# Patient Record
Sex: Female | Born: 1975 | Race: White | Hispanic: No | Marital: Married | Smoking: Never smoker
Health system: Southern US, Community
[De-identification: ages and names within clinical notes are randomized; demographics above are authoritative.]

## PROBLEM LIST (undated history)

## (undated) DIAGNOSIS — D569 Thalassemia, unspecified: Secondary | ICD-10-CM

## (undated) DIAGNOSIS — Z9289 Personal history of other medical treatment: Secondary | ICD-10-CM

## (undated) HISTORY — PX: CARPAL TUNNEL RELEASE: SHX101

## (undated) HISTORY — PX: ABDOMINAL HYSTERECTOMY: SHX81

---

## 2015-04-02 ENCOUNTER — Encounter (HOSPITAL_COMMUNITY): Payer: Self-pay | Admitting: *Deleted

## 2015-04-02 ENCOUNTER — Emergency Department (HOSPITAL_COMMUNITY): Payer: Self-pay

## 2015-04-02 ENCOUNTER — Emergency Department (HOSPITAL_COMMUNITY)
Admission: EM | Admit: 2015-04-02 | Discharge: 2015-04-02 | Disposition: A | Discharge status: Home / Self Care | Payer: Self-pay | Attending: Emergency Medicine | Admitting: Emergency Medicine

## 2015-04-02 DIAGNOSIS — Z862 Personal history of diseases of the blood and blood-forming organs and certain disorders involving the immune mechanism: Secondary | ICD-10-CM | POA: Insufficient documentation

## 2015-04-02 DIAGNOSIS — M791 Myalgia: Secondary | ICD-10-CM | POA: Insufficient documentation

## 2015-04-02 DIAGNOSIS — R079 Chest pain, unspecified: Secondary | ICD-10-CM | POA: Insufficient documentation

## 2015-04-02 DIAGNOSIS — R52 Pain, unspecified: Secondary | ICD-10-CM

## 2015-04-02 DIAGNOSIS — R51 Headache: Secondary | ICD-10-CM | POA: Insufficient documentation

## 2015-04-02 HISTORY — DX: Thalassemia, unspecified: D56.9

## 2015-04-02 HISTORY — DX: Personal history of other medical treatment: Z92.89

## 2015-04-02 LAB — COMPREHENSIVE METABOLIC PANEL
ALT: 16 U/L (ref 14–54)
ANION GAP: 7 (ref 5–15)
AST: 19 U/L (ref 15–41)
Albumin: 4.2 g/dL (ref 3.5–5.0)
Alkaline Phosphatase: 101 U/L (ref 38–126)
BUN: 10 mg/dL (ref 6–20)
CO2: 26 mmol/L (ref 22–32)
Calcium: 10.2 mg/dL (ref 8.9–10.3)
Chloride: 106 mmol/L (ref 101–111)
Creatinine, Ser: 0.8 mg/dL (ref 0.44–1.00)
GFR calc non Af Amer: >60 mL/min (ref >60)
Glucose, Bld: 102 mg/dL — ABNORMAL HIGH (ref 65–99)
Potassium: 4.2 mmol/L (ref 3.5–5.1)
SODIUM: 139 mmol/L (ref 135–145)
Total Bilirubin: 0.4 mg/dL (ref 0.3–1.2)
Total Protein: 7.5 g/dL (ref 6.5–8.1)

## 2015-04-02 LAB — TROPONIN I

## 2015-04-02 LAB — CBC WITH DIFFERENTIAL/PLATELET
BASOS PCT: 0 % (ref 0–1)
Basophils Absolute: 0 10*3/uL (ref 0.0–0.1)
EOS ABS: 0 10*3/uL (ref 0.0–0.7)
Eosinophils Relative: 0 % (ref 0–5)
HCT: 43.6 % (ref 36.0–46.0)
Hemoglobin: 14.1 g/dL (ref 12.0–15.0)
LYMPHS PCT: 26 % (ref 12–46)
Lymphs Abs: 1.7 10*3/uL (ref 0.7–4.0)
MCH: 31.4 pg (ref 26.0–34.0)
MCHC: 32.3 g/dL (ref 30.0–36.0)
MCV: 97.1 fL (ref 78.0–100.0)
MONO ABS: 0.6 10*3/uL (ref 0.1–1.0)
Monocytes Relative: 9 % (ref 3–12)
NEUTROS ABS: 4.2 10*3/uL (ref 1.7–7.7)
Neutrophils Relative %: 65 % (ref 43–77)
Platelets: 301 10*3/uL (ref 150–400)
RBC: 4.49 MIL/uL (ref 3.87–5.11)
RDW: 13.6 % (ref 11.5–15.5)
WBC: 6.5 10*3/uL (ref 4.0–10.5)

## 2015-04-02 LAB — TYPE AND SCREEN
ABO/RH(D): A POS
Antibody Screen: POSITIVE
DAT, IgG: NEGATIVE
PT AG Type: NEGATIVE

## 2015-04-02 MED ORDER — HYDROMORPHONE HCL 1 MG/ML IJ SOLN
1.0000 mg | Freq: Once | INTRAMUSCULAR | Status: AC
  Administered 2015-04-02: 1 mg via INTRAMUSCULAR
  Filled 2015-04-02: qty 1

## 2015-04-02 MED ORDER — ONDANSETRON 4 MG PO TBDP
4.0000 mg | ORAL_TABLET | Freq: Once | ORAL | Status: AC
  Administered 2015-04-02: 4 mg via ORAL
  Filled 2015-04-02: qty 1

## 2015-04-02 NOTE — ED Notes (Signed)
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

## 2015-04-02 NOTE — Discharge Instructions (Signed)
Chest Pain (Nonspecific) °It is often hard to give a specific diagnosis for the cause of chest pain. There is always a chance that your pain could be related to something serious, such as a heart attack or a blood clot in the lungs. You need to follow up with your health care provider for further evaluation. °CAUSES  °· Heartburn. °· Pneumonia or bronchitis. °· Anxiety or stress. °· Inflammation around your heart (pericarditis) or lung (pleuritis or pleurisy). °· A blood clot in the lung. °· A collapsed lung (pneumothorax). It can develop suddenly on its own (spontaneous pneumothorax) or from trauma to the chest. °· Shingles infection (herpes zoster virus). °The chest wall is composed of bones, muscles, and cartilage. Any of these can be the source of the pain. °· The bones can be bruised by injury. °· The muscles or cartilage can be strained by coughing or overwork. °· The cartilage can be affected by inflammation and become sore (costochondritis). °DIAGNOSIS  °Lab tests or other studies may be needed to find the cause of your pain. Your health care provider may have you take a test called an ambulatory electrocardiogram (ECG). An ECG records your heartbeat patterns over a 24-hour period. You may also have other tests, such as: °· Transthoracic echocardiogram (TTE). During echocardiography, sound waves are used to evaluate how blood flows through your heart. °· Transesophageal echocardiogram (TEE). °· Cardiac monitoring. This allows your health care provider to monitor your heart rate and rhythm in real time. °· Holter monitor. This is a portable device that records your heartbeat and can help diagnose heart arrhythmias. It allows your health care provider to track your heart activity for several days, if needed. °· Stress tests by exercise or by giving medicine that makes the heart beat faster. °TREATMENT  °· Treatment depends on what may be causing your chest pain. Treatment may include: °¨ Acid blockers for  heartburn. °¨ Anti-inflammatory medicine. °¨ Pain medicine for inflammatory conditions. °¨ Antibiotics if an infection is present. °· You may be advised to change lifestyle habits. This includes stopping smoking and avoiding alcohol, caffeine, and chocolate. °· You may be advised to keep your head raised (elevated) when sleeping. This reduces the chance of acid going backward from your stomach into your esophagus. °Most of the time, nonspecific chest pain will improve within 2-3 days with rest and mild pain medicine.  °HOME CARE INSTRUCTIONS  °· If antibiotics were prescribed, take them as directed. Finish them even if you start to feel better. °· For the next few days, avoid physical activities that bring on chest pain. Continue physical activities as directed. °· Do not use any tobacco products, including cigarettes, chewing tobacco, or electronic cigarettes. °· Avoid drinking alcohol. °· Only take medicine as directed by your health care provider. °· Follow your health care provider's suggestions for further testing if your chest pain does not go away. °· Keep any follow-up appointments you made. If you do not go to an appointment, you could develop lasting (chronic) problems with pain. If there is any problem keeping an appointment, call to reschedule. °SEEK MEDICAL CARE IF:  °· Your chest pain does not go away, even after treatment. °· You have a rash with blisters on your chest. °· You have a fever. °SEEK IMMEDIATE MEDICAL CARE IF:  °· You have increased chest pain or pain that spreads to your arm, neck, jaw, back, or abdomen. °· You have shortness of breath. °· You have an increasing cough, or you cough   up blood. °· You have severe back or abdominal pain. °· You feel nauseous or vomit. °· You have severe weakness. °· You faint. °· You have chills. °This is an emergency. Do not wait to see if the pain will go away. Get medical help at once. Call your local emergency services (911 in [place] Do not drive  yourself to the hospital. °MAKE SURE YOU:  °· Understand these instructions. °· Will watch your condition. °· Will get help right away if you are not doing well or get worse. °Document Released: 06/16/2005 Document Revised: 09/11/2013 Document Reviewed: 04/11/2008 °[employer]® Patient Information ©2015 [employer]. This information is not intended to replace advice given to you by your health care provider. Make sure you discuss any questions you have with your health care provider. °Musculoskeletal Pain °Musculoskeletal pain is muscle and boney aches and pains. These pains can occur in any part of the body. Your caregiver may treat you without knowing the cause of the pain. They may treat you if blood or urine tests, X-rays, and other tests were normal.  °CAUSES °There is often not a definite cause or reason for these pains. These pains may be caused by a type of germ (virus). The discomfort may also come from overuse. Overuse includes working out too hard when your body is not fit. Boney aches also come from weather changes. Bone is sensitive to atmospheric pressure changes. °HOME CARE INSTRUCTIONS  °· Ask when your test results will be ready. Make sure you get your test results. °· Only take over-the-counter or prescription medicines for pain, discomfort, or fever as directed by your caregiver. If you were given medications for your condition, do not drive, operate machinery or power tools, or sign legal documents for 24 hours. Do not drink alcohol. Do not take sleeping pills or other medications that may interfere with treatment. °· Continue all activities unless the activities cause more pain. When the pain lessens, slowly resume normal activities. Gradually increase the intensity and duration of the activities or exercise. °· During periods of severe pain, bed rest may be helpful. Lay or sit in any position that is comfortable. °· Putting ice on the injured area. °¨ Put ice in a bag. °¨ Place a towel between  your skin and the bag. °¨ Leave the ice on for 15 to 20 minutes, 3 to 4 times a day. °· Follow up with your caregiver for continued problems and no reason can be found for the pain. If the pain becomes worse or does not go away, it may be necessary to repeat tests or do additional testing. Your caregiver may need to look further for a possible cause. °SEEK IMMEDIATE MEDICAL CARE IF: °· You have pain that is getting worse and is not relieved by medications. °· You develop chest pain that is associated with shortness or breath, sweating, feeling sick to your stomach (nauseous), or throw up (vomit). °· Your pain becomes localized to the abdomen. °· You develop any new symptoms that seem different or that concern you. °MAKE SURE YOU:  °· Understand these instructions. °· Will watch your condition. °· Will get help right away if you are not doing well or get worse. °Document Released: 09/06/2005 Document Revised: 11/29/2011 Document Reviewed: 05/11/2013 °[employer]® Patient Information ©2015 [employer]. This information is not intended to replace advice given to you by your health care provider. Make sure you discuss any questions you have with your health care provider. ° °

## 2015-04-02 NOTE — ED Provider Notes (Signed)
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 States that these are similar symptoms that she has when she needs a blood transfusion. Reports her last transfusion was febrile to 15. 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 States her primary care doctor and hematologist are in [place]. She states that she has a friend with her that can drive. She denies any known fever. Has had mild cough. No vomiting or diarrhea.  Denies any aggravating or alleviating factors.  ROS: See HPI Constitutional: no fever  Eyes: no drainage  ENT: no runny nose   Cardiovascular:   chest pain  Resp:  SOB  GI: no vomiting GU: no dysuria Integumentary: no rash  Allergy: no hives  Musculoskeletal: no leg swelling  Neurological: no slurred speech ROS otherwise negative  PAST MEDICAL HISTORY/PAST SURGICAL HISTORY:  Past Medical History  Diagnosis Date  . Thalassemia   . History of blood transfusion     MEDICATIONS:  Prior to Admission medications   Not on File    ALLERGIES:  Allergies not on file  SOCIAL HISTORY:  History  Substance Use Topics  . Smoking status: Never Smoker   . Smokeless tobacco: Never Used  . Alcohol Use: No    FAMILY HISTORY: No family history on file.  EXAM: BP 144/86 mmHg  Pulse 94  Temp(Src) 97.5 F (36.4 C) (Oral)  Resp 18  Ht 5\' 5"  (1.651 m)  SpO2 100% CONSTITUTIONAL: Alert and oriented and responds appropriately to questions. Well-appearing; well-nourished, nontoxic, appears in no distress HEAD: Normocephalic EYES: Conjunctivae clear, PERRL ENT: normal nose; no rhinorrhea;  moist mucous membranes; pharynx without lesions noted NECK: Supple, no meningismus, no LAD  CARD: RRR; S1 and S2 appreciated; no murmurs, no clicks, no rubs, no gallops RESP: Normal chest excursion without splinting or tachypnea; breath sounds clear and equal bilaterally; no wheezes, no rhonchi, no rales, no hypoxia or respiratory distress, speaking full sentences ABD/GI: Normal bowel sounds; non-distended; soft, non-tender, no rebound, no guarding, no peritoneal signs BACK:  The back appears normal and is non-tender to palpation, there is no CVA tenderness EXT: Normal ROM in all joints; non-tender to palpation; no edema; normal capillary refill; no cyanosis, no calf tenderness or swelling    SKIN: Normal color for age and race; warm NEURO: Moves all extremities equally, sensation to light touch intact diffusely, cranial nerves II through XII intact PSYCH: The patient's mood and manner are appropriate. Grooming and personal hygiene are appropriate.  MEDICAL DECISION MAKING: Patient here with complaints of diffuse body pain, chest pain or shortness of breath, nausea that is similar to her prior episodes when she has needed blood transfusions due to her reported history of thalassemia. Today her hemoglobin is 14 and she is hemodynamically stable.electrolytes also within normal limits. Chest x-ray clear.  Troponin negative. EKG shows no ischemia. I feel she is safe to be discharged home with outpatient follow-up with her hematologist. She reports feeling better after dose of IM Dilaudid, Zofran in the ED.  Discussed return precautions. She verbalized understanding and is comfortable with plan.     EKG Interpretation  Date/Time:  Wednesday  April 02 2015 04:29:48 EDT Ventricular Rate:  79 PR Interval:  140 QRS Duration: 78 QT Interval:  376 QTC Calculation: 431 R Axis:   61 Text Interpretation:  Sinus rhythm RSR' in V1 or V2, probably normal variant No old tracing to compare Confirmed by Asmara Backs,  DO,  Peggi Yono [phone]) on 04/02/2015 4:35:54 AM        Layla Maw Naysha Sholl, DO 04/02/15 [id number]

## 2015-04-02 NOTE — ED Notes (Signed)
Pt complains of a blood disease similar to sickle cell disease, she states that she hurts all over and she usually has to receive blood transfusions and pain medications

## 2017-03-12 IMAGING — CR DG CHEST 2V
2 series · 2 of 2 positions shown · non-contrast
Comparison: None.

CLINICAL DATA: Chest pain.  Thalassemia.

EXAM:
CHEST  2 VIEW

[w chest pa]
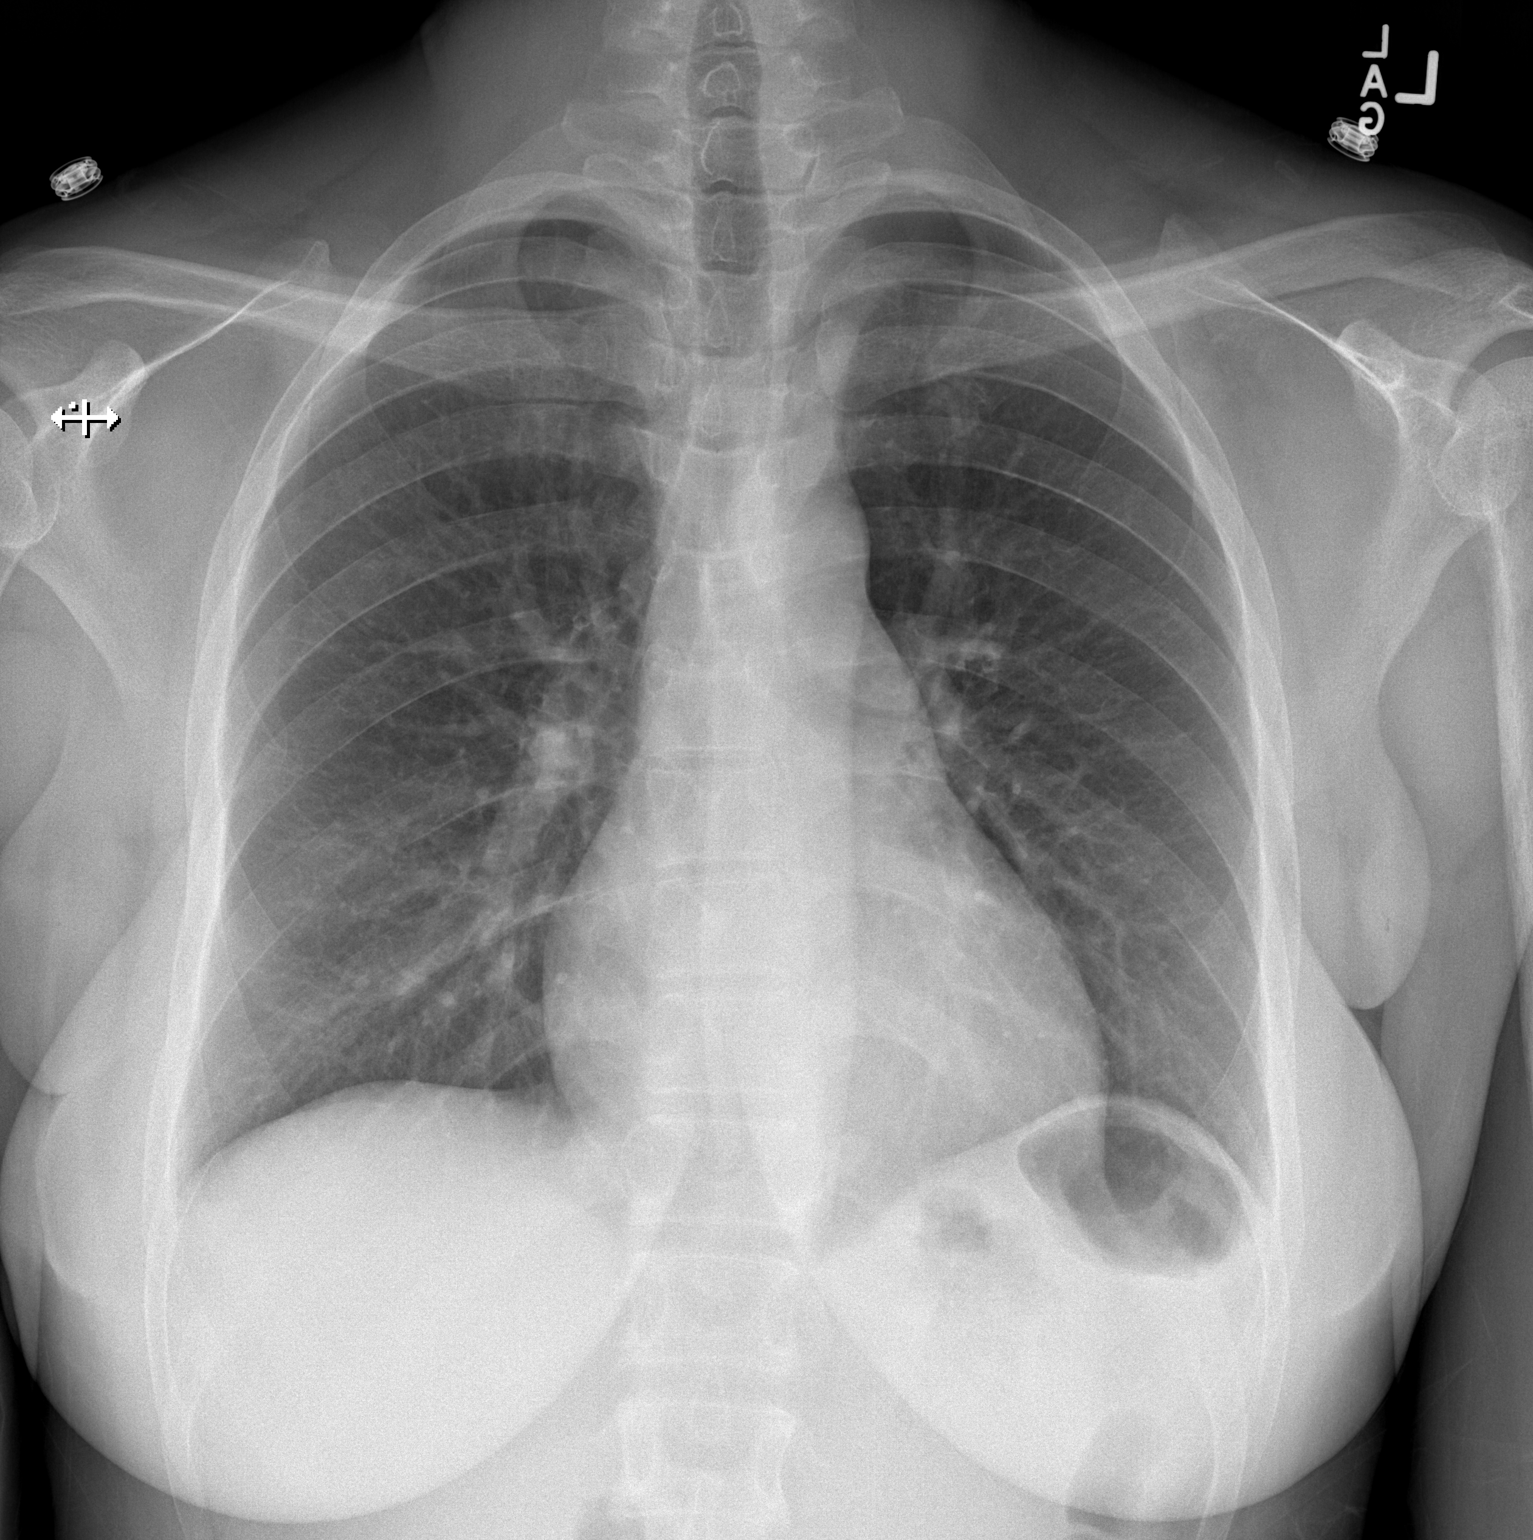

[w chest lat]
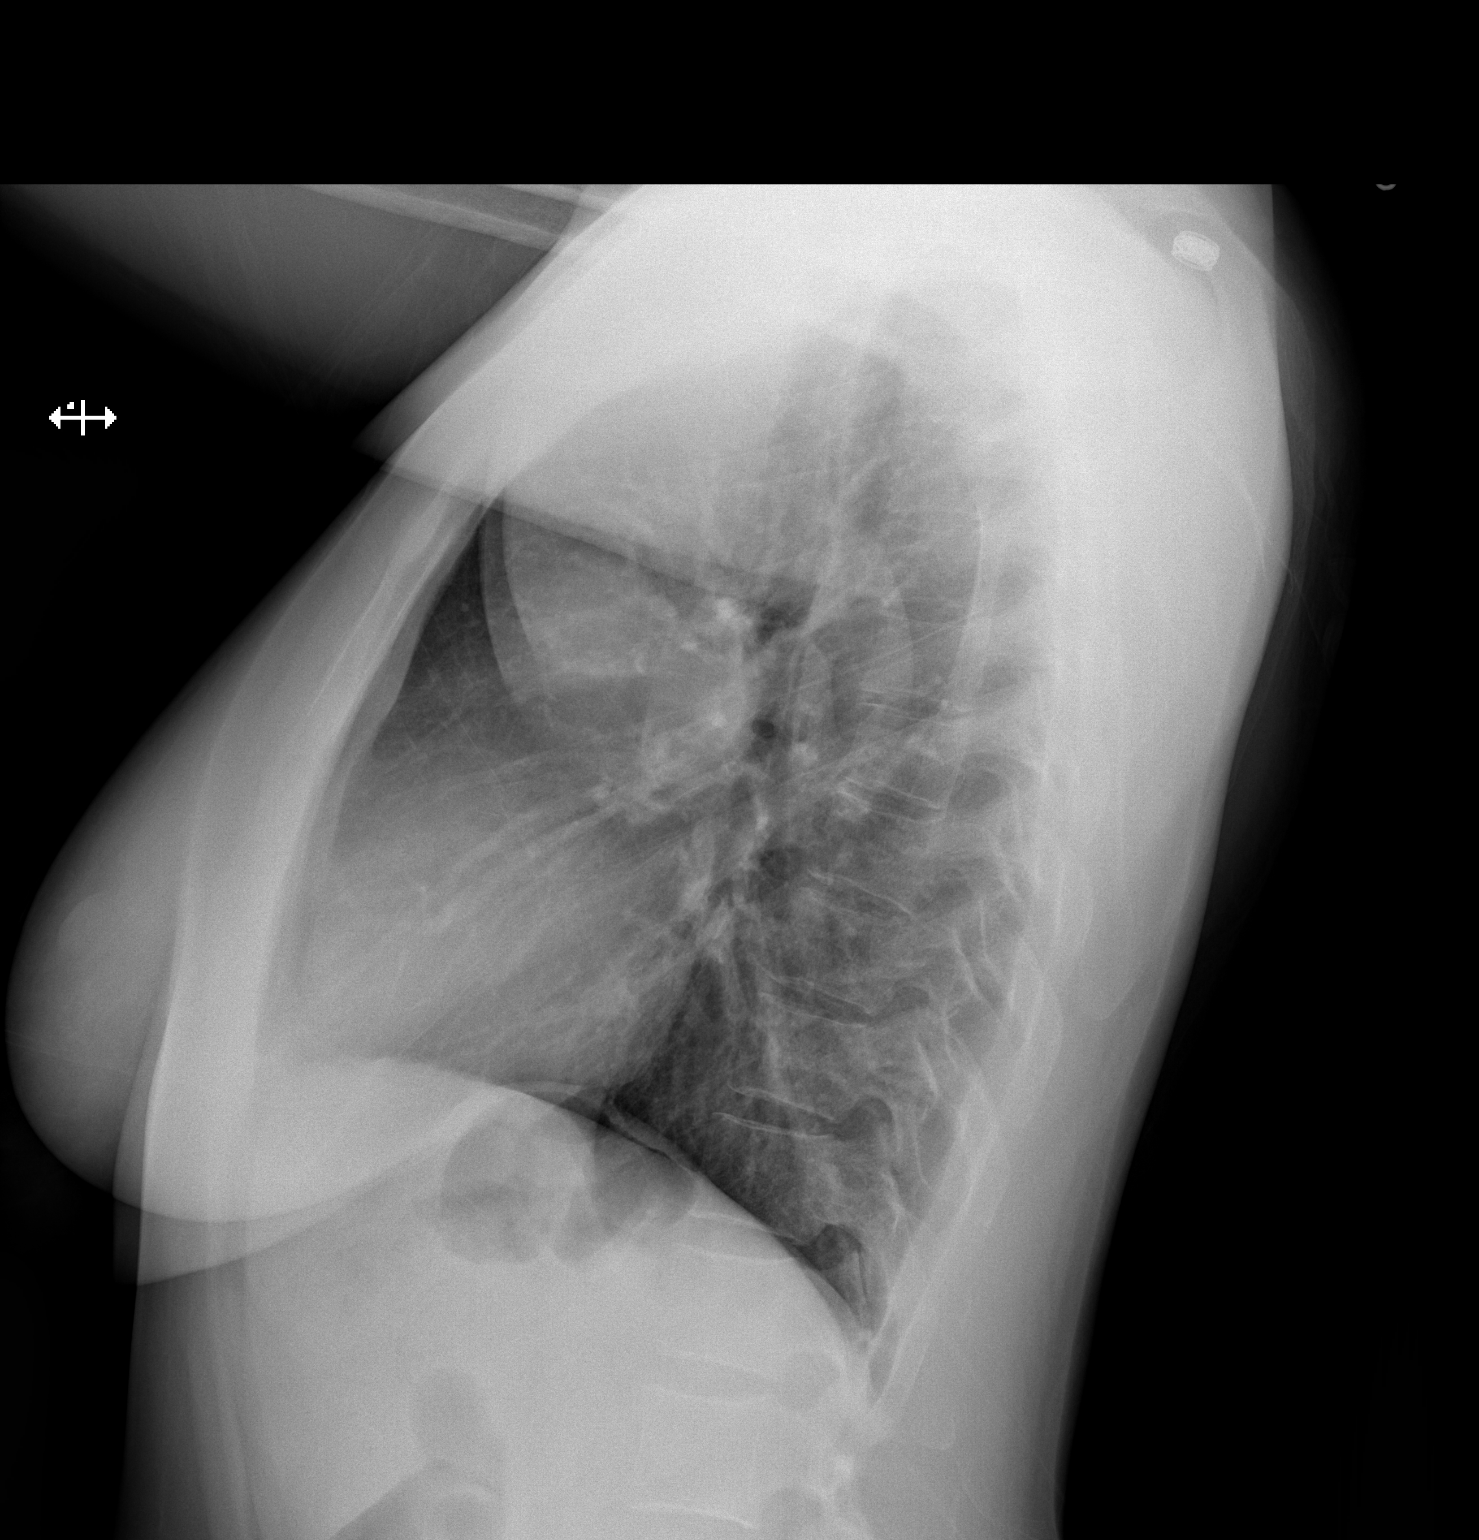

[2 of 2 positions shown; findings below may reference images not displayed]

FINDINGS: The heart size and mediastinal contours are within normal limits.
Both lungs are clear. The visualized skeletal structures are
unremarkable.
IMPRESSION: No active cardiopulmonary disease.
